# Patient Record
Sex: Female | Born: 1950 | Race: White | Hispanic: No | Marital: Married | Smoking: Never smoker
Health system: Southern US, Community
[De-identification: ages and names within clinical notes are randomized; demographics above are authoritative.]

## PROBLEM LIST (undated history)

## (undated) DIAGNOSIS — D649 Anemia, unspecified: Secondary | ICD-10-CM

## (undated) DIAGNOSIS — K219 Gastro-esophageal reflux disease without esophagitis: Secondary | ICD-10-CM

## (undated) DIAGNOSIS — N189 Chronic kidney disease, unspecified: Secondary | ICD-10-CM

---

## 1997-08-14 HISTORY — PX: APPENDECTOMY: SHX54

## 1998-07-26 ENCOUNTER — Other Ambulatory Visit: Admission: RE | Admit: 1998-07-26 | Discharge: 1998-07-26 | Payer: Self-pay | Admitting: Family Medicine

## 1998-07-30 ENCOUNTER — Inpatient Hospital Stay (HOSPITAL_COMMUNITY): Admission: EM | Admit: 1998-07-30 | Discharge: 1998-07-31 | Payer: Self-pay | Admitting: Emergency Medicine

## 1998-07-30 ENCOUNTER — Encounter: Payer: Self-pay | Admitting: General Surgery

## 1999-08-04 ENCOUNTER — Other Ambulatory Visit: Admission: RE | Admit: 1999-08-04 | Discharge: 1999-08-04 | Payer: Self-pay | Admitting: Family Medicine

## 2000-09-03 ENCOUNTER — Other Ambulatory Visit: Admission: RE | Admit: 2000-09-03 | Discharge: 2000-09-03 | Payer: Self-pay | Admitting: Family Medicine

## 2012-04-12 ENCOUNTER — Encounter (HOSPITAL_COMMUNITY): Payer: Self-pay | Admitting: *Deleted

## 2012-04-12 ENCOUNTER — Other Ambulatory Visit: Payer: Self-pay | Admitting: Urology

## 2012-04-12 NOTE — Pre-Procedure Instructions (Signed)
Asked to bring blue folder the day of the procedure,insurance card,I.D. driver's license,wear comfortable clothing and have a driver for the day. Asked not to take Advil,Motrin,Ibuprofen,Aleve or any NSAIDS, Aspirin, or Toradol for 72 hours prior to procedure,  No vitamins or herbal medications 7 days prior to procedure. Instructed to take laxative per doctor's office instructions and eat a light dinner the evening before procedure.   To arrive at 1430 for procedure.

## 2012-04-16 ENCOUNTER — Encounter (HOSPITAL_COMMUNITY): Payer: Self-pay | Admitting: Pharmacy Technician

## 2012-04-18 ENCOUNTER — Encounter (HOSPITAL_COMMUNITY)
Admission: RE | Disposition: A | Discharge status: Home / Self Care | Payer: Self-pay | Source: Ambulatory Visit | Attending: Urology

## 2012-04-18 ENCOUNTER — Ambulatory Visit (HOSPITAL_COMMUNITY): Payer: 59

## 2012-04-18 ENCOUNTER — Encounter (HOSPITAL_COMMUNITY): Payer: Self-pay | Admitting: *Deleted

## 2012-04-18 ENCOUNTER — Ambulatory Visit (HOSPITAL_COMMUNITY)
Admission: RE | Admit: 2012-04-18 | Discharge: 2012-04-18 | Disposition: A | Discharge status: Home / Self Care | Payer: 59 | Source: Ambulatory Visit | Attending: Urology | Admitting: Urology

## 2012-04-18 DIAGNOSIS — N201 Calculus of ureter: Secondary | ICD-10-CM

## 2012-04-18 DIAGNOSIS — E7800 Pure hypercholesterolemia, unspecified: Secondary | ICD-10-CM | POA: Insufficient documentation

## 2012-04-18 HISTORY — DX: Chronic kidney disease, unspecified: N18.9

## 2012-04-18 HISTORY — DX: Anemia, unspecified: D64.9

## 2012-04-18 HISTORY — DX: Gastro-esophageal reflux disease without esophagitis: K21.9

## 2012-04-18 SURGERY — LITHOTRIPSY, ESWL
Anesthesia: LOCAL | Laterality: Left

## 2012-04-18 MED ORDER — DIPHENHYDRAMINE HCL 25 MG PO CAPS
25.0000 mg | ORAL_CAPSULE | ORAL | Status: AC
  Administered 2012-04-18: 25 mg via ORAL
  Filled 2012-04-18: qty 1

## 2012-04-18 MED ORDER — CIPROFLOXACIN IN D5W 400 MG/200ML IV SOLN
400.0000 mg | INTRAVENOUS | Status: AC
  Administered 2012-04-18: 400 mg via INTRAVENOUS
  Filled 2012-04-18: qty 200

## 2012-04-18 MED ORDER — DIAZEPAM 5 MG PO TABS
10.0000 mg | ORAL_TABLET | ORAL | Status: AC
  Administered 2012-04-18: 10 mg via ORAL
  Filled 2012-04-18: qty 2

## 2012-04-18 MED ORDER — DEXTROSE-NACL 5-0.45 % IV SOLN
INTRAVENOUS | Status: DC
  Administered 2012-04-18: 15:00:00 via INTRAVENOUS

## 2012-04-18 NOTE — H&P (Signed)
History of Present Illness   Alison Mosley presents today as a referral from Dr. Windle Guard for a newly diagnosed multiple left ureteral calculi. Alison Mosley is a very pleasant 61 year old female without prior urologic history. She began experiencing some left-sided renal colic. Initially it was really unclear as to her diagnosis. She subsequently has undergone CT imaging. This showed bilateral nonobstructive renal calculi with some stones in the 4-5 mm size range. On the left side hydronephrosis was appreciated. The patient had multiple stones noted at the left ureteral pelvic junction/proximal ureter. The lead stone appeared to be about 5 mm in diameter. All told, there was 4-5 stones in my opinion, but it is difficult to say with certainty. The patient's CT scan was reviewed by myself. The patient also had a KUB today to see how well the stones are visualized. They are definitely opaque. Position is unchanged from her recent CT. She is currently having a moderate degree of pain. She has had some discomfort on and off for the past week. Her urinalysis today is relatively unremarkable and is without any significant pyuria or bacteruria.      Past Medical History Problems  1. History of  Heartburn 787.1 2. History of  Hypercholesterolemia 272.0  Surgical History Problems  1. History of  Appendectomy 2. History of  Cesarean Section  Current Meds 1. Levofloxacin 250 MG Oral Tablet; Therapy: 27Aug2013 to  Allergies Medication  1. Codeine Derivatives 2. Sulfa Drugs  Family History Problems  1. Paternal history of  Colon Cancer V16.0 2. Paternal history of  Death In The Family Father 70yrs 3. Maternal history of  Diabetes Mellitus V18.0 4. Maternal history of  Family Health Status - Mother's Age 90yrs 5. Family history of  Family Health Status Number Of Children 1 son 6. Maternal history of  Heart Disease V17.49 7. Maternal history of  Leukemia V16.6 8. 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  Genitourinary: nocturia and urinary hesitancy.    Vitals Vital Signs [Data Includes: Last 1 Day]  29Aug2013 03:02PM  BMI Calculated: 29.46 BSA Calculated: 1.89 Height: 5 ft 5.5 in Blood Pressure: 116 / 77 Temperature: 98.8 F Heart Rate: 91 29Aug2013 02:49PM  Weight: 179 lb   Physical Exam Constitutional: Well nourished and well developed . No acute distress.  Neck: The appearance of the neck is normal and no neck mass is present.  Pulmonary: No respiratory distress and normal respiratory rhythm and effort.  Cardiovascular: Heart rate and rhythm are normal . No peripheral edema.  Abdomen: The abdomen is soft and nontender. No masses are palpated. No CVA tenderness. No hernias are palpable. No hepatosplenomegaly noted.  Skin: Normal skin turgor, no visible rash and no visible skin lesions.  Neuro/Psych:. Mood and affect are appropriate.    Results/Data Urine [Data Includes: Last 1 Day]   29Aug2013  COLOR YELLOW   APPEARANCE CLEAR   SPECIFIC GRAVITY 1.010   pH 6.0   GLUCOSE NEG mg/dL  BILIRUBIN NEG   KETONE NEG mg/dL  BLOOD MOD   PROTEIN NEG mg/dL  UROBILINOGEN 0.2 mg/dL  NITRITE  NEG   LEUKOCYTE ESTERASE NEG   SQUAMOUS EPITHELIAL/HPF MODERATE   WBC 0-2 WBC/hpf  RBC 3-6 RBC/hpf  BACTERIA NONE SEEN   CRYSTALS NONE SEEN   CASTS NONE SEEN    Assessment Assessed  1. Ureteral Stone 592.1 2. Nephrolithiasis 592.0  Plan Health Maintenance (V70.0)  1. Ketorolac Tromethamine 30 MG/ML  Intramuscular Solution; INJECT 2 ML Intramuscular; Done:  29Aug2013 03:46PM; Status: COMPLETE - Retrospective Authorization 2. KUB  Done: 29Aug2013 12:00AM 3. UA With REFLEX  Done: 29Aug2013 02:35PM Ureteral Stone (592.1)  4. Ciprofloxacin HCl 250 MG Oral Tablet; Take 1 tablet daily; Therapy: 29Aug2013 to  (Evaluate:08Sep2013); Last Rx:29Aug2013 5. Sprix 15.75 MG/SPRAY Nasal Solution; 1 squirt in each nostril q 8 hours prnmax 5 days; Therapy: 29Aug2013 to (Last Rx:29Aug2013)  Discussion/Summary   [name] has multiple stones in her left proximal ureter. The diameter is no more than 4-5 mm but this appears to be 8-10 mm in length. Obviously, there is a chance of spontaneous passage but ultimately I think she probably will require treatment. Given the current size and location of her stone burden, lithotripsy would be ideal. Obviously, there is a possibility that she could develop some obstructing fragments and there is a moderate amount of stone burden that we need to treat. The Hounsfield units on this area are more around 800, indicating that the stone is not particularly hard and hopefully lithotripsy will be successful. The next available lithotripsy date would be approximately a week from today and we will attempt to see if we can get her on the schedule for that day.

## 2012-04-18 NOTE — Progress Notes (Signed)
Left flank with reddened area with no breakdown s/p lithotripsy.

## 2012-04-18 NOTE — Op Note (Signed)
See [facility] OP note scanned into chart. 

## 2017-12-03 ENCOUNTER — Encounter (HOSPITAL_COMMUNITY): Payer: Self-pay | Admitting: Emergency Medicine

## 2017-12-03 ENCOUNTER — Emergency Department (HOSPITAL_COMMUNITY): Payer: 59

## 2017-12-03 ENCOUNTER — Emergency Department (HOSPITAL_COMMUNITY)
Admission: EM | Admit: 2017-12-03 | Discharge: 2017-12-03 | Disposition: A | Discharge status: Home / Self Care | Payer: 59 | Attending: Emergency Medicine | Admitting: Emergency Medicine

## 2017-12-03 DIAGNOSIS — R1084 Generalized abdominal pain: Secondary | ICD-10-CM | POA: Diagnosis not present

## 2017-12-03 DIAGNOSIS — R300 Dysuria: Secondary | ICD-10-CM | POA: Insufficient documentation

## 2017-12-03 DIAGNOSIS — R109 Unspecified abdominal pain: Secondary | ICD-10-CM

## 2017-12-03 DIAGNOSIS — N3001 Acute cystitis with hematuria: Secondary | ICD-10-CM | POA: Insufficient documentation

## 2017-12-03 LAB — URINALYSIS, ROUTINE W REFLEX MICROSCOPIC
BILIRUBIN URINE: NEGATIVE
Glucose, UA: NEGATIVE mg/dL
KETONES UR: NEGATIVE mg/dL
Nitrite: NEGATIVE
PROTEIN: NEGATIVE mg/dL
SPECIFIC GRAVITY, URINE: 1.011 (ref 1.005–1.030)
pH: 5 (ref 5.0–8.0)

## 2017-12-03 LAB — COMPREHENSIVE METABOLIC PANEL
ALBUMIN: 4.2 g/dL (ref 3.5–5.0)
ALT: 29 U/L (ref 14–54)
AST: 28 U/L (ref 15–41)
Alkaline Phosphatase: 82 U/L (ref 38–126)
Anion gap: 11 (ref 5–15)
BUN: 15 mg/dL (ref 6–20)
CHLORIDE: 107 mmol/L (ref 101–111)
CO2: 23 mmol/L (ref 22–32)
Calcium: 9.1 mg/dL (ref 8.9–10.3)
Creatinine, Ser: 0.95 mg/dL (ref 0.44–1.00)
GFR calc non Af Amer: >60 mL/min (ref >60)
GLUCOSE: 115 mg/dL — AB (ref 65–99)
POTASSIUM: 4.4 mmol/L (ref 3.5–5.1)
SODIUM: 141 mmol/L (ref 135–145)
Total Bilirubin: 0.7 mg/dL (ref 0.3–1.2)
Total Protein: 7.8 g/dL (ref 6.5–8.1)

## 2017-12-03 LAB — CBC WITH DIFFERENTIAL/PLATELET
Basophils Absolute: 0.1 10*3/uL (ref 0.0–0.1)
Basophils Relative: 1 %
Eosinophils Absolute: 0 10*3/uL (ref 0.0–0.7)
Eosinophils Relative: 0 %
HEMATOCRIT: 44.6 % (ref 36.0–46.0)
HEMOGLOBIN: 14.9 g/dL (ref 12.0–15.0)
LYMPHS PCT: 10 %
Lymphs Abs: 0.8 10*3/uL (ref 0.7–4.0)
MCH: 32.7 pg (ref 26.0–34.0)
MCHC: 33.4 g/dL (ref 30.0–36.0)
MCV: 98 fL (ref 78.0–100.0)
MONO ABS: 0.5 10*3/uL (ref 0.1–1.0)
MONOS PCT: 7 %
NEUTROS ABS: 6.6 10*3/uL (ref 1.7–7.7)
NEUTROS PCT: 82 %
Platelets: 215 10*3/uL (ref 150–400)
RBC: 4.55 MIL/uL (ref 3.87–5.11)
RDW: 13 % (ref 11.5–15.5)
WBC: 8 10*3/uL (ref 4.0–10.5)

## 2017-12-03 MED ORDER — KETOROLAC TROMETHAMINE 30 MG/ML IJ SOLN
30.0000 mg | Freq: Once | INTRAMUSCULAR | Status: AC
  Administered 2017-12-03: 30 mg via INTRAVENOUS
  Filled 2017-12-03: qty 1

## 2017-12-03 MED ORDER — SODIUM CHLORIDE 0.9 % IV SOLN
1.0000 g | Freq: Once | INTRAVENOUS | Status: AC
  Administered 2017-12-03: 1 g via INTRAVENOUS
  Filled 2017-12-03: qty 10

## 2017-12-03 MED ORDER — IBUPROFEN 800 MG PO TABS: 800.0000 mg | ORAL_TABLET | Freq: Three times a day (TID) | ORAL | 0 refills | Status: AC | PRN

## 2017-12-03 MED ORDER — CEPHALEXIN 500 MG PO CAPS: 500.0000 mg | ORAL_CAPSULE | Freq: Three times a day (TID) | ORAL | 0 refills | Status: AC

## 2017-12-03 NOTE — Discharge Instructions (Signed)

## 2017-12-03 NOTE — ED Triage Notes (Signed)
Pt c/o right lower back/flank pain that started this morning around 5am and dysuria.

## 2017-12-03 NOTE — ED Provider Notes (Signed)
Emergency Department Provider Note   I have reviewed the triage vital signs and the nursing notes.   HISTORY  Chief Complaint Flank Pain and Dysuria   HPI Alison Mosley is a 67 y.o. female with PMH of kidney stone, CKD, and anemia presents to the emergency department for evaluation of dysuria and right flank pain starting abruptly at 5 AM.  Patient treated herself with Vagisil but pain worsened.  She denies any vaginal bleeding or discharge.  She primarily has pain with urination and some urgency.  She denies any fevers but has had chills at times.  Denies any chest pain or dyspnea.  She does have a history of prior kidney stone which required lithotripsy 5 years prior.  Her pain at this time is severe, intermittent, and radiating to the right groin. No modifying factors.    Past Medical History:  Diagnosis Date  . Anemia   . Chronic kidney disease    kidney stones  . GERD (gastroesophageal reflux disease)    takes tums    Patient Active Problem List   Diagnosis Date Noted  . Ureteral calculi 04/18/2012    Past Surgical History:  Procedure Laterality Date  . APPENDECTOMY  1999  . CESAREAN SECTION  1978    Current Outpatient Rx  . Order #: [id number] Class: Print  . Order #: [id number] Class: Print  . Order #: [id number] Class: Historical Med  . Order #: [id number] Class: Historical Med    Allergies Codeine and Sulfa antibiotics  No family history on file.  Social History Social History   Tobacco Use  . Smoking status: Never Smoker  . Smokeless tobacco: Never Used  Substance Use Topics  . Alcohol use: No  . Drug use: No    Review of Systems  Constitutional: No fever but questionable chills.  Eyes: No visual changes. ENT: No sore throat. Cardiovascular: Denies chest pain. Respiratory: Denies shortness of breath. Gastrointestinal: No abdominal pain.  No nausea, no vomiting.  No diarrhea.  No constipation. Genitourinary: Positive dysuria and right flank pain.    Musculoskeletal: Negative for back pain. Skin: Negative for rash. Neurological: Negative for headaches, focal weakness or numbness.  10-point ROS otherwise negative.  ____________________________________________   PHYSICAL EXAM:  VITAL SIGNS: ED Triage Vitals  Enc Vitals Group     BP 12/03/17 0744 126/78     Pulse Rate 12/03/17 0744 71     Resp 12/03/17 0744 19     Temp 12/03/17 0744 97.7 F (36.5 C)     Temp Source 12/03/17 0744 Oral     SpO2 12/03/17 0744 97 %     Weight --      Height 12/03/17 0742 5\' 6"  (1.676 m)     Pain Score 12/03/17 0742 10   Constitutional: Alert and oriented. Well appearing and in no acute distress. Eyes: Conjunctivae are normal.  Head: Atraumatic. Nose: No congestion/rhinnorhea. Mouth/Throat: Mucous membranes are moist.  Oropharynx non-erythematous. Neck: No stridor.   Cardiovascular: Normal rate, regular rhythm. Good peripheral circulation. Grossly normal heart sounds.   Respiratory: Normal respiratory effort.  No retractions. Lungs CTAB. Gastrointestinal: Soft and nontender. No distention. Mild right CVA tenderness.  Musculoskeletal: No lower extremity tenderness nor edema. No gross deformities of extremities. Neurologic:  Normal speech and language. No gross focal neurologic deficits are appreciated.  Skin:  Skin is warm, dry and intact. No rash noted.  ____________________________________________   LABS (all labs ordered are listed, but only abnormal results are displayed)  Labs Reviewed  URINALYSIS,  ROUTINE W REFLEX MICROSCOPIC - Abnormal; Notable for the following components:      Result Value   Hgb urine dipstick LARGE (*)    Leukocytes, UA SMALL (*)    Bacteria, UA RARE (*)    Squamous Epithelial / LPF 0-5 (*)    All other components within normal limits  COMPREHENSIVE METABOLIC PANEL - Abnormal; Notable for the following components:   Glucose, Bld 115 (*)    All other components within normal limits  URINE CULTURE  CBC WITH  DIFFERENTIAL/PLATELET   ____________________________________________  RADIOLOGY  Ct Renal Stone Study  Result Date: 12/03/2017 CLINICAL DATA:  Acute right flank and groin pain. EXAM: CT ABDOMEN AND PELVIS WITHOUT CONTRAST TECHNIQUE: Multidetector CT imaging of the abdomen and pelvis was performed following the standard protocol without IV contrast. COMPARISON:  CT scan of November 27, 2014. FINDINGS: Lower chest: No acute abnormality. Hepatobiliary: Fatty infiltration of the liver is noted. No gallstones or biliary dilatation is noted. Pancreas: Unremarkable. No pancreatic ductal dilatation or surrounding inflammatory changes. Spleen: Normal in size without focal abnormality. Adrenals/Urinary Tract: Adrenal glands appear normal. Stable left renal calculus is noted. No hydronephrosis or renal obstruction is noted. No ureteral calculi are noted. Urinary bladder is unremarkable. Stomach/Bowel: The stomach appears normal. There is no evidence of bowel obstruction or inflammation. Status post appendectomy. Vascular/Lymphatic: No significant vascular findings are present. No enlarged abdominal or pelvic lymph nodes. Reproductive: Uterus and bilateral adnexa are unremarkable. Other: No abdominal wall hernia or abnormality. No abdominopelvic ascites. Musculoskeletal: No acute or significant osseous findings. IMPRESSION: Fatty infiltration of the liver. Nonobstructive left renal calculus. No hydronephrosis or renal obstruction is noted. Aortic Atherosclerosis (ICD10-I70.0). Electronically Signed   By: Lupita RaiderJames  Green Jr, M.D.   On: 12/03/2017 10:48    ____________________________________________   PROCEDURES  Procedure(s) performed:   Procedures  None ____________________________________________   INITIAL IMPRESSION / ASSESSMENT AND PLAN / ED COURSE  Pertinent labs & imaging results that were available during my care of the patient were reviewed by me and considered in my medical decision making (see  chart for details).  Patient presents to the emergency department for evaluation of right flank pain with dysuria.  She has a history of prior kidney stone requiring lithotripsy.  Afebrile here.  Plan for CT renal given history of complicated stones and questionable chills this morning.  Patient's pain treated with Toradol.  UA pending.  Added additional labs including CMP.   Patient CT renal reviewed and with no acute findings. Patient with possible UTI on UA. Otherwise normal blood work. Plan for Rocephin here and Keflex for home use x 7 days. Provided contact information for [facility] to call if symptoms continue to recur or if hematuria/flank pain returns.   At this time, I do not feel there is any life-threatening condition present. I have reviewed and discussed all results (EKG, imaging, lab, urine as appropriate), exam findings with patient. I have reviewed nursing notes and appropriate previous records.  I feel the patient is safe to be discharged home without further emergent workup. Discussed usual and customary return precautions. Patient and family (if present) verbalize understanding and are comfortable with this plan.  Patient will follow-up with their primary care provider. If they do not have a primary care provider, information for follow-up has been provided to them. All questions have been answered.  ____________________________________________  FINAL CLINICAL IMPRESSION(S) / ED DIAGNOSES  Final diagnoses:  Acute cystitis with hematuria  Flank pain  MEDICATIONS GIVEN DURING THIS VISIT:  Medications  ketorolac (TORADOL) 30 MG/ML injection 30 mg (30 mg Intravenous Given 12/03/17 1002)  cefTRIAXone (ROCEPHIN) 1 g in sodium chloride 0.9 % 100 mL IVPB (0 g Intravenous Stopped 12/03/17 1346)     NEW OUTPATIENT MEDICATIONS STARTED DURING THIS VISIT:  Discharge Medication List as of 12/03/2017  1:13 PM    START taking these medications   Details  cephALEXin (KEFLEX)  500 MG capsule Take 1 capsule (500 mg total) by mouth 3 (three) times daily for 7 days., Starting Mon 12/03/2017, Until Mon 12/10/2017, Print    ibuprofen (ADVIL,MOTRIN) 800 MG tablet Take 1 tablet (800 mg total) by mouth every 8 (eight) hours as needed., Starting Mon 12/03/2017, Print        Note:  This document was prepared using Dragon voice recognition software and may include unintentional dictation errors.  Alona Bene, MD Emergency Medicine    Long, Arlyss Repress, MD 12/03/17 [phone]

## 2017-12-04 LAB — URINE CULTURE: Special Requests: NORMAL

## 2017-12-05 ENCOUNTER — Telehealth: Payer: Self-pay | Admitting: Emergency Medicine

## 2017-12-05 NOTE — Telephone Encounter (Signed)
Post ED Visit - Positive Culture Follow-up  Culture report reviewed by antimicrobial stewardship pharmacist:  []  Enzo BiNathan Batchelder, Pharm.D. []  Celedonio MiyamotoJeremy Frens, Pharm.D., BCPS AQ-ID []  Garvin FilaMike Maccia, Pharm.D., BCPS []  Georgina PillionElizabeth Martin, Pharm.D., BCPS []  SomersetMinh Pham, [address] Rainbow BoulevardPharm.D., BCPS, AAHIVP []  Estella HuskMichelle Turner, Pharm.D., BCPS, AAHIVP []  Lysle Pearlachel Rumbarger, PharmD, BCPS []  Blake DivineShannon Parkey, PharmD []  Pollyann SamplesAndy Johnston, PharmD, BCPS Sharin MonsEmily Sinclair PharmD  Positive urine culture Treated with cephalexin, organism sensitive to the same and no further patient follow-up is required at this time.  Berle MullMiller, Hollace Michelli 12/05/2017, 12:43 PM

## 2019-10-23 ENCOUNTER — Ambulatory Visit: Payer: Medicare Other

## 2020-01-02 ENCOUNTER — Ambulatory Visit
Admission: RE | Admit: 2020-01-02 | Discharge: 2020-01-02 | Disposition: A | Discharge status: Home / Self Care | Payer: Medicare Other | Source: Ambulatory Visit | Attending: Family Medicine | Admitting: Family Medicine

## 2020-01-02 ENCOUNTER — Other Ambulatory Visit: Payer: Self-pay | Admitting: Family Medicine

## 2020-01-02 DIAGNOSIS — R059 Cough, unspecified: Secondary | ICD-10-CM

## 2021-01-19 ENCOUNTER — Other Ambulatory Visit: Payer: Self-pay | Admitting: Family Medicine

## 2021-01-19 ENCOUNTER — Ambulatory Visit
Admission: RE | Admit: 2021-01-19 | Discharge: 2021-01-19 | Disposition: A | Discharge status: Home / Self Care | Payer: Medicare Other | Source: Ambulatory Visit | Attending: Family Medicine | Admitting: Family Medicine

## 2021-01-19 DIAGNOSIS — R059 Cough, unspecified: Secondary | ICD-10-CM

## 2021-01-26 ENCOUNTER — Other Ambulatory Visit: Payer: Self-pay | Admitting: Family Medicine

## 2021-01-26 DIAGNOSIS — R059 Cough, unspecified: Secondary | ICD-10-CM

## 2021-01-26 DIAGNOSIS — R5383 Other fatigue: Secondary | ICD-10-CM

## 2021-02-03 ENCOUNTER — Other Ambulatory Visit: Payer: Self-pay

## 2021-02-03 ENCOUNTER — Ambulatory Visit: Payer: Medicare Other

## 2021-02-03 DIAGNOSIS — R059 Cough, unspecified: Secondary | ICD-10-CM

## 2021-02-03 DIAGNOSIS — R5383 Other fatigue: Secondary | ICD-10-CM

## 2021-06-13 IMAGING — CR DG CHEST 2V
2 series · 2 of 2 positions shown · non-contrast
Comparison: 01/02/2020.

CLINICAL DATA: Cough.  Shortness of breath.

EXAM:
CHEST - 2 VIEW

[w chest pa]
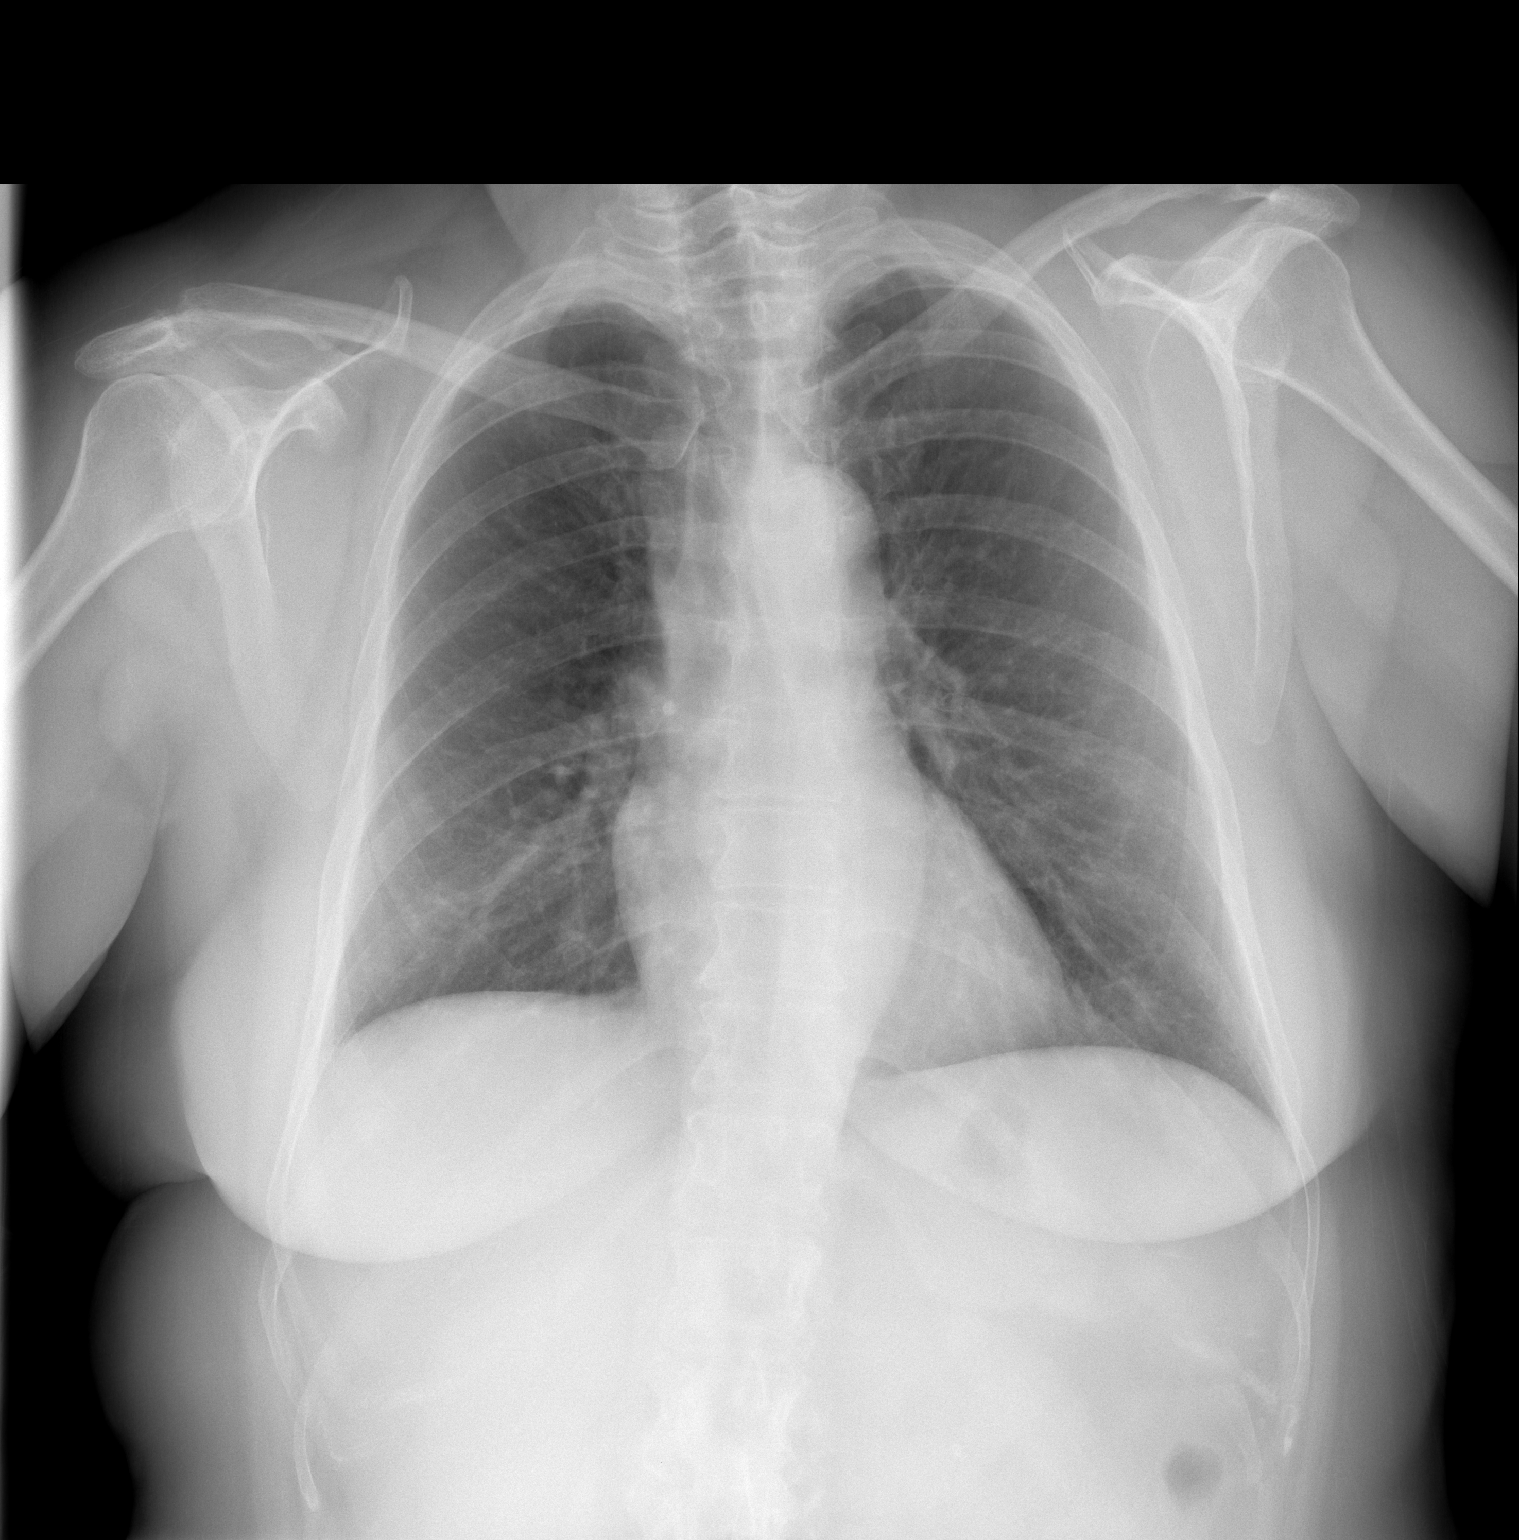

[w chest lat]
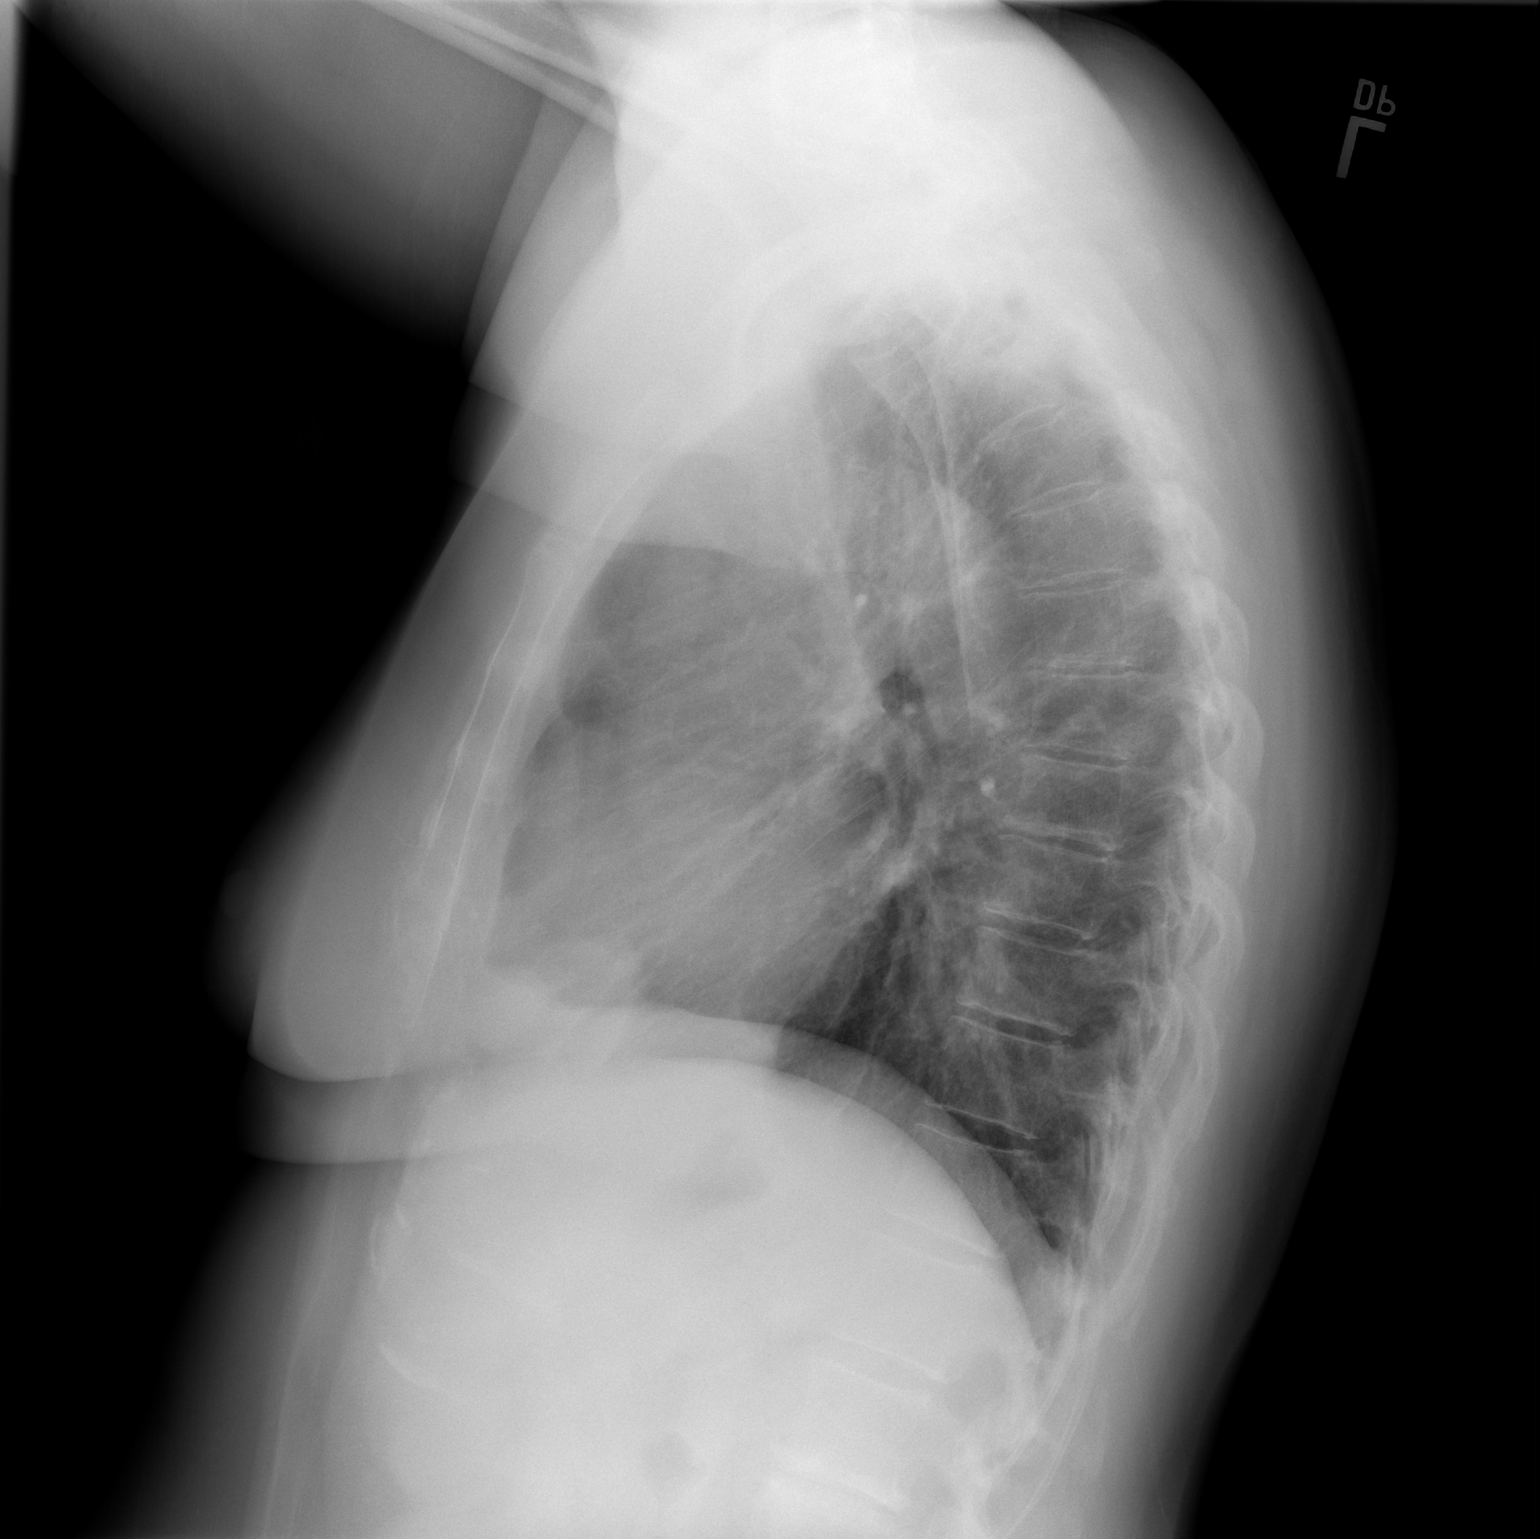

[2 of 2 positions shown; findings below may reference images not displayed]

FINDINGS: The heart size and mediastinal contours are within normal limits.
Low lung volumes. No focal infiltrate. Mild degenerative change
thoracic spine.
IMPRESSION: Low lung volumes.  No focal infiltrate.

## 2024-03-12 ENCOUNTER — Ambulatory Visit: Admitting: Pulmonary Disease

## 2024-07-29 ENCOUNTER — Other Ambulatory Visit: Payer: Self-pay

## 2024-07-29 ENCOUNTER — Encounter: Payer: Self-pay | Admitting: Internal Medicine

## 2024-07-29 ENCOUNTER — Ambulatory Visit: Payer: Self-pay | Admitting: Internal Medicine

## 2024-07-29 ENCOUNTER — Telehealth: Payer: Self-pay | Admitting: Internal Medicine

## 2024-07-29 VITALS — BP 124/78 | HR 85 | Resp 20 | Ht 66.3 in | Wt 196.7 lb

## 2024-07-29 DIAGNOSIS — J3089 Other allergic rhinitis: Secondary | ICD-10-CM

## 2024-07-29 DIAGNOSIS — K219 Gastro-esophageal reflux disease without esophagitis: Secondary | ICD-10-CM | POA: Diagnosis not present

## 2024-07-29 DIAGNOSIS — J45991 Cough variant asthma: Secondary | ICD-10-CM

## 2024-07-29 MED ORDER — AZELASTINE HCL 0.1 % NA SOLN: 2.0000 | Freq: Two times a day (BID) | NASAL | 12 refills | Status: DC

## 2024-07-29 MED ORDER — AZELASTINE HCL 0.1 % NA SOLN: 2.0000 | Freq: Two times a day (BID) | NASAL | 12 refills | Status: AC

## 2024-07-29 MED ORDER — BUDESONIDE-FORMOTEROL FUMARATE 80-4.5 MCG/ACT IN AERO: 2.0000 | INHALATION_SPRAY | Freq: Two times a day (BID) | RESPIRATORY_TRACT | 12 refills | Status: AC

## 2024-07-29 MED ORDER — BUDESONIDE-FORMOTEROL FUMARATE 80-4.5 MCG/ACT IN AERO: 2.0000 | INHALATION_SPRAY | Freq: Two times a day (BID) | RESPIRATORY_TRACT | 12 refills | Status: DC

## 2024-07-29 NOTE — Progress Notes (Signed)
 NEW PATIENT Date of Service/Encounter:  07/29/2024 Referring provider: Dorene Perkins, NP Primary care provider: Zachary Lamar BRAVO, NP  Subjective:  Alison Mosley is a 73 y.o. female  presenting today for evaluation of chronic cough  History obtained from: chart review and patient.   Discussed the use of AI scribe software for clinical note transcription with the patient, who gave verbal consent to proceed.  History of Present Illness DENETTA FEI is a 73 year old female who presents with chronic cough.  Chronic cough - Persistent dry cough for several months, worsening over the past six months - Cough onset and worsening after COVID-19 infection in March - Cough is triggered by activities such as eating and talking - Albuterol use exacerbates cough - Regular use of nebulizer - Recent change in antihypertensive medication from lisinopril due to concern for cough, but symptoms have worsened since the change - Cough frequently interrupts sleep, causing nocturnal awakenings - Frequent throat clearing and sensation of drainage, though cough remains dry - Prednisone prescribed by pulmonology provided partial improvement - No history of smoking, asthma, or recurrent sinus/ear infections  Associated upper airway and gastrointestinal symptoms - Dry mouth and occasional hoarseness - Sensation of postnasal drainage - Omeprazole for reflux has not improved cough - No history of seasonal allergies  Fatigue and functional impairment - Significant fatigue and lack of energy - Simple activities such as grocery shopping result in exhaustion  Sleep disturbance - Unable to sleep in bed due to emotional distress related to late husband's passing in January - Sleeping on couch instead - Nocturnal cough disrupts sleep  Allergic and dermatologic history - No known drug or food allergies - No history of eczema or chronic urticaria    Chart Review:  Pulm 05/14/24: diagnosed with chronic  cough multifactorial.  Given omeprazole for reflux, benzoate, prednisone and albuterol for nebulizer.  Specific IgE done which was negative to all environmentals, AEC 100; hypersensitivity pneumonitis panel done which was negative  Other allergy screening: Asthma: chronic  Rhino conjunctivitis: no Food allergy: no Medication allergy: no Hymenoptera allergy: no Urticaria: no Eczema:no History of recurrent infections suggestive of immunodeficency: no Vaccinations are up to date.   Past Medical History: Past Medical History:  Diagnosis Date   Anemia    Chronic kidney disease    kidney stones   GERD (gastroesophageal reflux disease)    takes tums   Medication List:  Current Outpatient Medications  Medication Sig Dispense Refill   azelastine  (ASTELIN ) 0.1 % nasal spray Place 2 sprays into both nostrils 2 (two) times daily. Use in each nostril as directed 30 mL 12   budesonide -formoterol  (SYMBICORT ) 80-4.5 MCG/ACT inhaler Inhale 2 puffs into the lungs 2 (two) times daily. 1 each 12   ezetimibe (ZETIA) 10 MG tablet Take 10 mg by mouth daily.     hydrochlorothiazide (HYDRODIURIL) 12.5 MG tablet 1 tablet.     levothyroxine (SYNTHROID) 25 MCG tablet Take 25 mcg by mouth daily.     lisinopril-hydrochlorothiazide (ZESTORETIC) 20-12.5 MG tablet Take 1 tablet by mouth daily.     nystatin cream (MYCOSTATIN) Apply 1 Application topically.     Vitamin D, Ergocalciferol, (DRISDOL) 1.25 MG (50000 UNIT) CAPS capsule Take by mouth.     ibuprofen  (ADVIL ,MOTRIN ) 800 MG tablet Take 1 tablet (800 mg total) by mouth every 8 (eight) hours as needed. (Patient not taking: Reported on 07/29/2024) 21 tablet 0   Multiple Vitamin (MULTIVITAMIN) tablet Take 1 tablet by mouth every morning. (Patient  not taking: Reported on 07/29/2024)     traMADol (ULTRAM) 50 MG tablet Take 50 mg by mouth every 6 (six) hours as needed. Pain (Patient not taking: Reported on 07/29/2024)     No current facility-administered  medications for this visit.   Known Allergies:  Allergies[1] Past Surgical History: Past Surgical History:  Procedure Laterality Date   APPENDECTOMY  1999   CESAREAN SECTION  1978   Family History: Family History  Problem Relation Age of Onset   Lung cancer Maternal Grandfather    Social History: Siarah lives single-family home is 73 years old.  No water damage.  Hardwood floors and carpet in bedroom.  Heat pump for heating and cooling.  1 cat with access to bedroom.  2 dogs outside the home.  No roaches in the house and bed is 2 feet off the floor retired never smoker.  No dust mite precautions.  Some vape exposure inside the car.  ROS:  All other systems negative except as noted per HPI.  Objective:  Blood pressure 124/78, pulse 85, resp. rate 20, height 5' 6.3 (1.684 m), weight 196 lb 11.2 oz (89.2 kg), SpO2 97%. Body mass index is 31.46 kg/m. Physical Exam:  General Appearance:  Alert, cooperative, no distress, appears stated age  Head:  Normocephalic, without obvious abnormality, atraumatic  Eyes:  Conjunctiva clear, EOM's intact  Ears EACs normal bilaterally and normal TMs bilaterally  Nose: Nares normal, normal mucosa, no visible anterior polyps, and septum midline  Throat: Lips, tongue normal; teeth and gums normal, surgically absent tonsils and mildly erythematous posterior oropharynx  Neck: Supple, symmetrical  Lungs:   Prolonged expiratory phase and clear to auscultation bilaterally, Respirations unlabored, intermittent dry coughing  Heart:  regular rate and rhythm and no murmur, Appears well perfused  Extremities: No edema  Skin: Skin color, texture, turgor normal and no rashes or lesions on visualized portions of skin  Neurologic: No gross deficits   Diagnostics: Spirometry:  Tracings reviewed. Her effort: effort okay for first attempt at spirometry. FVC: 1.60 L (pre), 1.76L  (post) FEV1: 1.46 L, 62% predicted (pre), 1.51L, 64%% predicted (post) FEV1/FVC  ratio: 91 (pre), 86 (post)  Interpretation: Spirometry consistent with mixed obstructive and restrictive disease.  Significant postbronchodilator response 10% in FVC Please see scanned spirometry results for details.   Labs:  Lab Orders  No laboratory test(s) ordered today     Assessment and Plan  Assessment and Plan Assessment & Plan Chronic cough Persisting for six months, exacerbated post-COVID and ACE- inhibitor switch. Dry cough unresponsive to omeprazole, no upper airway symptoms. Worsens with exercise, associated with fatigue. Differential includes cough-variant asthma, cough hypersensitivity syndrome, and dry mouth contribution.  - your lung testing today showed a significant post bronchodilator response, this is diagnostic of asthma  - Controller Inhaler: Start symbicort  80mcg 2 puffs twice a day; This Should Be Used Everyday - Rinse mouth out after use - During respiratory illness or asthma flares: Increase Symbicort  80mcg  4 puffs  and continue for 2 weeks or until symptoms resolve. - Rescue Inhaler: albuterol-1 vial via nebulizer. Use  every 4-6 hours as needed for chest tightness, wheezing, or coughing.  Can also use 15 minutes prior to exercise if you have symptoms with activity. - Asthma is not controlled if:  - Symptoms are occurring >2 times a week OR  - >2 times a month nighttime awakenings  - You are requiring systemic steroids (prednisone/steroid injections) more than once per year  - Your require  hospitalization for your asthma.  - Please call the clinic to schedule a follow up if these symptoms arise  Continue omeprazole 40mg  daily, take 30 minutes prior to eating  Continue dietary and lifestyle modifications    Start Astelin  (azelastine ) use 1-2 sprays in each nostril 1-2 times daily  Follow up for allergy testing (1-55)   Continue to drink water, keep hard candy or cough drop in mouth to encourage saliva production   Monitor for symptom improvement in  severity, duration and frequency of symptoms   May consider addition of gapabentin or pregabalin if no or partial response to the above   Follow up: on Tuesday 12/23 at 1:30 for allergy testing (1-55)   -hold all antihistamines for 3 days prior     This note in its entirety was forwarded to the Provider who requested this consultation.  Other: spacer provided , reviewed spirometry technique, and reviewed inhaler technique  Thank you for your kind referral. I appreciate the opportunity to take part in Denetria's care. Please do not hesitate to contact me with questions.  Sincerely,  Thank you so much for letting me partake in your care today.  Don't hesitate to reach out if you have any additional concerns!  Hargis Springer, MD  [facility] [place] [place]          [1]  Allergies Allergen Reactions   Codeine Nausea And Vomiting   Sulfa Antibiotics Nausea And Vomiting

## 2024-07-29 NOTE — Telephone Encounter (Signed)
 Pharmacy updated and patient informed medications resent

## 2024-07-29 NOTE — Patient Instructions (Signed)
 Chronic cough Persisting for six months, exacerbated post-COVID and ACE- inhibitor switch. Dry cough unresponsive to omeprazole, no upper airway symptoms. Worsens with exercise, associated with fatigue. Differential includes cough-variant asthma, cough hypersensitivity syndrome, and dry mouth contribution.  - your lung testing today showed a significant post bronchodilator response, this is diagnostic of asthma  - Controller Inhaler: Start symbicort  80mcg 2 puffs twice a day; This Should Be Used Everyday - Rinse mouth out after use - During respiratory illness or asthma flares: Increase Symbicort  80mcg  4 puffs  and continue for 2 weeks or until symptoms resolve. - Rescue Inhaler: albuterol-1 vial via nebulizer. Use  every 4-6 hours as needed for chest tightness, wheezing, or coughing.  Can also use 15 minutes prior to exercise if you have symptoms with activity. - Asthma is not controlled if:  - Symptoms are occurring >2 times a week OR  - >2 times a month nighttime awakenings  - You are requiring systemic steroids (prednisone/steroid injections) more than once per year  - Your require hospitalization for your asthma.  - Please call the clinic to schedule a follow up if these symptoms arise  Continue omeprazole 40mg  daily, take 30 minutes prior to eating  Continue dietary and lifestyle modifications    Start Astelin  (azelastine ) use 1-2 sprays in each nostril 1-2 times daily  Follow up for allergy testing (1-55)   Continue to drink water, keep hard candy or cough drop in mouth to encourage saliva production   Monitor for symptom improvement in severity, duration and frequency of symptoms   May consider addition of gapabentin or pregabalin if no or partial response to the above   Follow up: on Tuesday 12/23 at 1:30 for allergy testing (1-55)   -hold all antihistamines for 3 days prior

## 2024-07-29 NOTE — Telephone Encounter (Signed)
 Pt called and said that her medication (azelastine  (ASTELIN ) 0.1 % nasal spray [[id number]] and budesonide -formoterol  (SYMBICORT ) 80-4.5 MCG/ACT inhaler [[id number]] ) was sent to [facility] and she wanted it sent to [facility] in [place].

## 2024-07-30 NOTE — Addendum Note (Signed)
 Addended by: MARINDA JANSKY on: 07/30/2024 01:22 PM   Modules accepted: Orders

## 2024-08-05 ENCOUNTER — Ambulatory Visit: Admitting: Internal Medicine

## 2024-08-05 DIAGNOSIS — J3089 Other allergic rhinitis: Secondary | ICD-10-CM

## 2024-08-05 NOTE — Patient Instructions (Addendum)
 Allergy test  (08/05/24): Grass, molds, dust mite, cat, dog Start avoidance measures  Chronic cough Persisting for six months, exacerbated post-COVID and ACE- inhibitor switch. Dry cough unresponsive to omeprazole, no upper airway symptoms. Worsens with exercise, associated with fatigue. Differential includes cough-variant asthma, cough hypersensitivity syndrome, and dry mouth contribution.  - your lung testing today showed a significant post bronchodilator response, this is diagnostic of asthma  - Controller Inhaler: Continue symbicort  80mcg 2 puffs twice a day; This Should Be Used Everyday - Rinse mouth out after use - During respiratory illness or asthma flares: Increase Symbicort  80mcg  4 puffs  and continue for 2 weeks or until symptoms resolve. - Rescue Inhaler: albuterol-1 vial via nebulizer. Use  every 4-6 hours as needed for chest tightness, wheezing, or coughing.  Can also use 15 minutes prior to exercise if you have symptoms with activity. - Asthma is not controlled if:  - Symptoms are occurring >2 times a week OR  - >2 times a month nighttime awakenings  - You are requiring systemic steroids (prednisone/steroid injections) more than once per year  - Your require hospitalization for your asthma.  - Please call the clinic to schedule a follow up if these symptoms arise  Continue omeprazole 40mg  daily, take 30 minutes prior to eating  Continue dietary and lifestyle modifications    Continue Astelin  (azelastine ) use 1-2 sprays in each nostril 1-2 times daily   Continue to drink water, keep hard candy or cough drop in mouth to encourage saliva production   Monitor for symptom improvement in severity, duration and frequency of symptoms   May consider addition of gapabentin or pregabalin if no or partial response to the above   Follow up: 6 weeks   Reducing Pollen Exposure  The American Academy of Allergy, Asthma and Immunology suggests the following steps to reduce your  exposure to pollen during allergy seasons.    Do not hang sheets or clothing out to dry; pollen may collect on these items. Do not mow lawns or spend time around freshly cut grass; mowing stirs up pollen. Keep windows closed at night.  Keep car windows closed while driving. Minimize morning activities outdoors, a time when pollen counts are usually at their highest. Stay indoors as much as possible when pollen counts or humidity is high and on windy days when pollen tends to remain in the air longer. Use air conditioning when possible.  Many air conditioners have filters that trap the pollen spores. Use a HEPA room air filter to remove pollen form the indoor air you breathe.  Control of Mold Allergen   Mold and fungi can grow on a variety of surfaces provided certain temperature and moisture conditions exist.  Outdoor molds grow on plants, decaying vegetation and soil.  The major outdoor mold, Alternaria and Cladosporium, are found in very high numbers during hot and dry conditions.  Generally, a late Summer - Fall peak is seen for common outdoor fungal spores.  Rain will temporarily lower outdoor mold spore count, but counts rise rapidly when the rainy period ends.  The most important indoor molds are Aspergillus and Penicillium.  Dark, humid and poorly ventilated basements are ideal sites for mold growth.  The next most common sites of mold growth are the bathroom and the kitchen.  Outdoor (Seasonal) Mold Control  Use air conditioning and keep windows closed Avoid exposure to decaying vegetation. Avoid leaf raking. Avoid grain handling. Consider wearing a face mask if working in moldy areas.  Indoor (Perennial) Mold Control   Maintain humidity below 50%. Clean washable surfaces with 5% bleach solution. Remove sources e.g. contaminated carpets.    DUST MITE AVOIDANCE MEASURES:  There are three main measures that need and can be taken to avoid house dust mites:  Reduce  accumulation of dust in general -reduce furniture, clothing, carpeting, books, stuffed animals, especially in bedroom  Separate yourself from the dust -use pillow and mattress encasements (can be found at stores such as [employer] or online) -avoid direct exposure to air condition flow -use a HEPA filter device, especially in the bedroom; you can also use a HEPA filter vacuum cleaner -wipe dust with a moist towel instead of a dry towel or broom when cleaning  Decrease mites and/or their secretions -wash clothing and linen and stuffed animals at highest temperature possible, at least every 2 weeks -stuffed animals can also be placed in a bag and put in a freezer overnight  Despite the above measures, it is impossible to eliminate dust mites or their allergen completely from your home.  With the above measures the burden of mites in your home can be diminished, with the goal of minimizing your allergic symptoms.  Success will be reached only when implementing and using all means together.  Control of Dog or Cat Allergen  Avoidance is the best way to manage a dog or cat allergy. If you have a dog or cat and are allergic to dog or cats, consider removing the dog or cat from the home. If you have a dog or cat but dont want to find it a new home, or if your family wants a pet even though someone in the household is allergic, here are some strategies that may help keep symptoms at bay:  Keep the pet out of your bedroom and restrict it to only a few rooms. Be advised that keeping the dog or cat in only one room will not limit the allergens to that room. Dont pet, hug or kiss the dog or cat; if you do, wash your hands with soap and water. High-efficiency particulate air (HEPA) cleaners run continuously in a bedroom or living room can reduce allergen levels over time. Regular use of a high-efficiency vacuum cleaner or a central vacuum can reduce allergen levels. Giving your dog or cat a  bath at least once a week can reduce airborne allergen.

## 2024-08-05 NOTE — Progress Notes (Signed)
 " Date of Service/Encounter:  08/05/2024  Allergy testing appointment   Initial visit on 07/29/24, seen for chronic cough  .  Please see that note for additional details.  Today reports for allergy diagnostic testing:    DIAGNOSTICS:  Skin Testing: Environmental allergy panel. Adequate positive and negative controls Results discussed with patient/family.  Airborne Adult Perc - 08/05/24 1336     Time Antigen Placed 1336    Allergen Manufacturer Jestine    Location Back    Number of Test 55    1. Control-Buffer 50% Glycerol Negative    2. Control-Histamine 3+    3. Bahia Negative    4. Bermuda Negative    5. Johnson Negative    6. Kentucky  Blue Negative    7. Meadow Fescue Negative    8. Perennial Rye Negative    9. Timothy Negative    10. Ragweed Mix Negative    11. Cocklebur Negative    12. Plantain,  English Negative    13. Baccharis Negative    14. Dog Fennel Negative    15. Russian Thistle Negative    16. Lamb's Quarters Negative    17. Sheep Sorrell Negative    18. Rough Pigweed Negative    19. Marsh Elder, Rough Negative    20. Mugwort, Common Negative    21. Box, Elder Negative    22. Cedar, red Negative    23. Sweet Gum Negative    24. Pecan Pollen Negative    25. Pine Mix Negative    26. Walnut, Black Pollen Negative    27. Red Mulberry Negative    28. Ash Mix Negative    29. Birch Mix Negative    30. Beech American Negative    31. Cottonwood, Eastern Negative    32. Hickory, White Negative    33. Maple Mix Negative    34. Oak, Eastern Mix Negative    35. Sycamore Eastern Negative    36. Alternaria Alternata Negative    37. Cladosporium Herbarum Negative    38. Aspergillus Mix Negative    39. Penicillium Mix Negative    40. Bipolaris Sorokiniana (Helminthosporium) Negative    41. Drechslera Spicifera (Curvularia) Negative    42. Mucor Plumbeus Negative    43. Fusarium Moniliforme Negative    44. Aureobasidium Pullulans (pullulara) Negative    45.  Rhizopus Oryzae Negative    46. Botrytis Cinera Negative    47. Epicoccum Nigrum Negative    48. Phoma Betae Negative    49. Dust Mite Mix Negative    50. Cat Hair 10,000 BAU/ml Negative    51.  Dog Epithelia Negative    52. Mixed Feathers Negative    53. Horse Epithelia Negative    54. Cockroach, German Negative    55. Tobacco Leaf Negative          Intradermal - 08/05/24 1400     Control Negative    Bahia Negative    Bermuda 2+    Johnson 2+    7 Grass Negative    Ragweed Mix Negative    Weed Mix Negative    Tree Mix Negative    Mold 1 2+    Mold 2 Negative    Mold 3 3+    Mold 4 Negative    Mite Mix 2+    Cat 2+    Dog 2+    Cockroach Negative          Allergy testing results were read and interpreted by  myself, documented by clinical staff.  Patient provided with copy of allergy testing along with avoidance measures when indicated.   Hargis Springer, MD  [facility]        "

## 2024-09-16 ENCOUNTER — Ambulatory Visit: Admitting: Internal Medicine

## 2024-09-23 ENCOUNTER — Ambulatory Visit: Admitting: Internal Medicine
